# Patient Record
Sex: Male | Born: 1990 | Race: White | Hispanic: No | Marital: Married | Smoking: Current every day smoker
Health system: Southern US, Community
[De-identification: ages and names within clinical notes are randomized; demographics above are authoritative.]

## PROBLEM LIST (undated history)

## (undated) HISTORY — PX: TONSILLECTOMY: SUR1361

---
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
## 2012-03-20 ENCOUNTER — Emergency Department: Payer: Self-pay | Admitting: Emergency Medicine

## 2012-03-20 LAB — URINALYSIS, COMPLETE
Blood: NEGATIVE
Glucose,UR: NEGATIVE mg/dL (ref 0–75)
Ketone: NEGATIVE
Leukocyte Esterase: NEGATIVE
Protein: NEGATIVE
Specific Gravity: 1.016 (ref 1.003–1.030)
Squamous Epithelial: NONE SEEN

## 2012-03-20 LAB — COMPREHENSIVE METABOLIC PANEL
Alkaline Phosphatase: 107 U/L (ref 50–136)
Calcium, Total: 9.1 mg/dL (ref 8.5–10.1)
Chloride: 104 mmol/L (ref 98–107)
Co2: 31 mmol/L (ref 21–32)
Creatinine: 1.16 mg/dL (ref 0.60–1.30)
EGFR (African American): >60
Glucose: 100 mg/dL — ABNORMAL HIGH (ref 65–99)
Potassium: 3.9 mmol/L (ref 3.5–5.1)
SGPT (ALT): 59 U/L
Sodium: 138 mmol/L (ref 136–145)

## 2012-03-20 LAB — CBC
HCT: 49.8 % (ref 40.0–52.0)
HGB: 17.2 g/dL (ref 13.0–18.0)
MCH: 31.4 pg (ref 26.0–34.0)
MCHC: 34.5 g/dL (ref 32.0–36.0)
MCV: 91 fL (ref 80–100)
Platelet: 199 10*3/uL (ref 150–440)
RBC: 5.48 10*6/uL (ref 4.40–5.90)
RDW: 13.3 % (ref 11.5–14.5)
WBC: 8.9 10*3/uL (ref 3.8–10.6)

## 2013-11-28 ENCOUNTER — Emergency Department: Payer: Self-pay | Admitting: Emergency Medicine

## 2015-04-11 ENCOUNTER — Emergency Department
Admission: EM | Admit: 2015-04-11 | Discharge: 2015-04-11 | Disposition: A | Discharge status: Home / Self Care | Payer: Self-pay | Attending: Emergency Medicine | Admitting: Emergency Medicine

## 2015-04-11 ENCOUNTER — Encounter: Payer: Self-pay | Admitting: Urgent Care

## 2015-04-11 ENCOUNTER — Emergency Department: Payer: Self-pay

## 2015-04-11 DIAGNOSIS — R1031 Right lower quadrant pain: Secondary | ICD-10-CM

## 2015-04-11 DIAGNOSIS — K529 Noninfective gastroenteritis and colitis, unspecified: Secondary | ICD-10-CM | POA: Insufficient documentation

## 2015-04-11 DIAGNOSIS — Z720 Tobacco use: Secondary | ICD-10-CM | POA: Insufficient documentation

## 2015-04-11 LAB — URINALYSIS COMPLETE WITH MICROSCOPIC (ARMC ONLY)
BILIRUBIN URINE: NEGATIVE
Glucose, UA: NEGATIVE mg/dL
Hgb urine dipstick: NEGATIVE
Ketones, ur: NEGATIVE mg/dL
Leukocytes, UA: NEGATIVE
Nitrite: NEGATIVE
Protein, ur: NEGATIVE mg/dL
Specific Gravity, Urine: 1.014 (ref 1.005–1.030)
Squamous Epithelial / LPF: NONE SEEN
pH: 6 (ref 5.0–8.0)

## 2015-04-11 LAB — COMPREHENSIVE METABOLIC PANEL
ALBUMIN: 4.6 g/dL (ref 3.5–5.0)
ALT: 28 U/L (ref 17–63)
ANION GAP: 8 (ref 5–15)
AST: 25 U/L (ref 15–41)
Alkaline Phosphatase: 79 U/L (ref 38–126)
BUN: 9 mg/dL (ref 6–20)
CO2: 23 mmol/L (ref 22–32)
CREATININE: 1.1 mg/dL (ref 0.61–1.24)
Calcium: 9.4 mg/dL (ref 8.9–10.3)
Chloride: 104 mmol/L (ref 101–111)
GFR calc Af Amer: >60 mL/min (ref >60)
GFR calc non Af Amer: >60 mL/min (ref >60)
Glucose, Bld: 103 mg/dL — ABNORMAL HIGH (ref 65–99)
POTASSIUM: 3.1 mmol/L — AB (ref 3.5–5.1)
SODIUM: 135 mmol/L (ref 135–145)
Total Bilirubin: 0.6 mg/dL (ref 0.3–1.2)
Total Protein: 7.7 g/dL (ref 6.5–8.1)

## 2015-04-11 LAB — CBC
HCT: 50.4 % (ref 40.0–52.0)
Hemoglobin: 17.2 g/dL (ref 13.0–18.0)
MCH: 31.5 pg (ref 26.0–34.0)
MCHC: 34.2 g/dL (ref 32.0–36.0)
MCV: 92.3 fL (ref 80.0–100.0)
PLATELETS: 181 10*3/uL (ref 150–440)
RBC: 5.47 MIL/uL (ref 4.40–5.90)
RDW: 13.2 % (ref 11.5–14.5)
WBC: 7.9 10*3/uL (ref 3.8–10.6)

## 2015-04-11 LAB — LIPASE, BLOOD: Lipase: 28 U/L (ref 22–51)

## 2015-04-11 MED ORDER — IOHEXOL 300 MG/ML  SOLN
100.0000 mL | Freq: Once | INTRAMUSCULAR | Status: AC | PRN
  Administered 2015-04-11: 100 mL via INTRAVENOUS

## 2015-04-11 MED ORDER — IOHEXOL 240 MG/ML SOLN
25.0000 mL | Freq: Once | INTRAMUSCULAR | Status: AC | PRN
  Administered 2015-04-11: 25 mL via ORAL

## 2015-04-11 MED ORDER — LOPERAMIDE HCL 2 MG PO CAPS
4.0000 mg | ORAL_CAPSULE | Freq: Once | ORAL | Status: AC
  Administered 2015-04-11: 4 mg via ORAL
  Filled 2015-04-11: qty 2

## 2015-04-11 MED ORDER — SODIUM CHLORIDE 0.9 % IV BOLUS (SEPSIS)
1000.0000 mL | Freq: Once | INTRAVENOUS | Status: AC
  Administered 2015-04-11: 1000 mL via INTRAVENOUS

## 2015-04-11 NOTE — ED Provider Notes (Signed)
[facility] Emergency Department Provider Note  ____________________________________________  Time seen: 10:20 PM  I have reviewed the triage vital signs and the nursing notes.   HISTORY  Chief Complaint Abdominal Cramping and Fever    HPI Keith Castillo is a 24 y.o. male who complains of abdominal pain and diarrhea since 3:00 AM today. The abdominal pain started periumbilically but over the day and seemed to migrate toward the right lower quadrant. He's had persistent diarrhea about 20 episodes, worse with food. He last had any oral intake about 4 hours ago.  Denies any medical history, prior surgeries, or prior similar symptoms. He ate normal food recently and does not think that he had any kind of food borne pathogen exposure.  He reports a fever at home to 101.2.  He has had some nausea but no vomiting. No chest pain shortness of breath cough syncope or urinary symptoms.  Abdominal pain is right lower quadrant, sharp, moderate intensity, nonradiating, no aggravating or alleviating factors.   History reviewed. No pertinent past medical history.  There are no active problems to display for this patient.   History reviewed. No pertinent past surgical history.  No current outpatient prescriptions on file.  Allergies Review of patient's allergies indicates no known allergies.  History reviewed. No pertinent family history.  Social History History  Substance Use Topics  . Smoking status: Current Every Day Smoker  . Smokeless tobacco: Never Used  . Alcohol Use: Yes     Comment: .5 gallon of liquor per week    Review of Systems  Constitutional: No fever or chills. No weight changes Eyes:No blurry vision or double vision.  ENT: No sore throat. Cardiovascular: No chest pain. Respiratory: No dyspnea or cough. Gastrointestinal: Abdominal pain as above.  No BRBPR or melena. Genitourinary: Negative for dysuria, urinary retention, bloody urine, or  difficulty urinating. Musculoskeletal: Negative for back pain. No joint swelling or pain. Skin: Negative for rash. Neurological: Negative for headaches, focal weakness or numbness. Psychiatric:No anxiety or depression.   Endocrine:No hot/cold intolerance, changes in energy, or sleep difficulty.  10-point ROS otherwise negative.  ____________________________________________   PHYSICAL EXAM:  VITAL SIGNS: ED Triage Vitals  Enc Vitals Group     BP 04/11/15 2047 139/85 mmHg     Pulse Rate 04/11/15 2047 103     Resp 04/11/15 2047 18     Temp 04/11/15 2047 99.5 F (37.5 C)     Temp Source 04/11/15 2047 Oral     SpO2 04/11/15 2047 97 %     Weight 04/11/15 2047 200 lb (90.719 kg)     Height 04/11/15 2047  (1.651 m)     Head Cir --      Peak Flow --      Pain Score 04/11/15 2048 9     Pain Loc --      Pain Edu? --      Excl. in GC? --      Constitutional: Alert and oriented. Well appearing and in no distress. Eyes: No scleral icterus. No conjunctival pallor. PERRL. EOMI ENT   Head: Normocephalic and atraumatic.   Nose: No congestion/rhinnorhea. No septal hematoma   Mouth/Throat: MMM, no pharyngeal erythema. No peritonsillar mass. No uvula shift.   Neck: No stridor. No SubQ emphysema. No meningismus. Hematological/Lymphatic/Immunilogical: No cervical lymphadenopathy. Cardiovascular: RRR. Normal and symmetric distal pulses are present in all extremities. No murmurs, rubs, or gallops. Respiratory: Normal respiratory effort without tachypnea nor retractions. Breath sounds are clear  and equal bilaterally. No wheezes/rales/rhonchi. Gastrointestinal: Soft with right lower quadrant tenderness.. No distention. There is no CVA tenderness.  No rebound, rigidity, or guarding. Negative obturator, negative Rovsing Genitourinary: deferred Musculoskeletal: Nontender with normal range of motion in all extremities. No joint effusions.  No lower extremity tenderness.  No  edema. Neurologic:   Normal speech and language.  CN 2-10 normal. Motor grossly intact. No pronator drift.  Normal gait. No gross focal neurologic deficits are appreciated.  Skin:  Skin is warm, dry and intact. No rash noted.  No petechiae, purpura, or bullae. Psychiatric: Mood and affect are normal. Speech and behavior are normal. Patient exhibits appropriate insight and judgment.  ____________________________________________    LABS (pertinent positives/negatives) (all labs ordered are listed, but only abnormal results are displayed) Labs Reviewed  COMPREHENSIVE METABOLIC PANEL - Abnormal; Notable for the following:    Potassium 3.1 (*)    Glucose, Bld 103 (*)    All other components within normal limits  URINALYSIS COMPLETEWITH MICROSCOPIC ([facility] ONLY) - Abnormal; Notable for the following:    Color, Urine YELLOW (*)    APPearance CLEAR (*)    Bacteria, UA RARE (*)    All other components within normal limits  LIPASE, BLOOD  CBC   ____________________________________________   EKG    ____________________________________________    RADIOLOGY  CT abdomen and pelvis pending  ____________________________________________   PROCEDURES  ____________________________________________   INITIAL IMPRESSION / ASSESSMENT AND PLAN / ED COURSE  Pertinent labs & imaging results that were available during my care of the patient were reviewed by me and considered in my medical decision making (see chart for details).  Patient presents with right lower quadrant pain. Exam and history are concerning for appendicitis. Low suspicion for perforation and abscess AAA cystitis or pyelonephritis. No evidence of sepsis. The patient is stable, comfortable. We will do a CT scan of the abdomen pelvis to evaluate for appendicitis, and then manage accordingly. Care of the patient will be signed out to the oncoming physician Dr. Manson PasseyBrown will follow-up on the scan and determine disposition.  Patient declines antiemetics or analgesics at this time, but we will give doses as needed for symptom relief if they should worsen.  ____________________________________________   FINAL CLINICAL IMPRESSION(S) / ED DIAGNOSES  Final diagnoses:  Acute right lower quadrant pain      Sharman CheekPhillip Silveria Botz, MD 04/11/15 2235

## 2015-04-11 NOTE — Discharge Instructions (Signed)

## 2015-04-11 NOTE — ED Notes (Signed)
Patient presents with c/p of diffuse abdominal cramping with (+) diarrhea since yesterday. Denies N/V/D and urinary symptoms.

## 2015-04-11 NOTE — ED Provider Notes (Signed)
I assumed care of the patient from Dr. Scotty CourtStafford 11:00 PM. CT scan of the abdomen revealed enteritis involving the terminal ileum. I spoke with the patient who informed me that it was an acute onset today of diarrhea with no episodes like this in the past. Consider the possibility of inflammatory bowel disease namely Crohn's disease however given acute nature with no preceding symptoms this is unlikely. Hence the radiologist concern for enteritis is most likely. Given the possibility that this might be the initial event of Crohn disease patient was advised that if it were to reoccur he is to follow-up with Dr. Ellsworth Lennoxtejan sie. Patient received 4 mg of Imodium prior to discharge from the emergency department In addition the patient was advised to drink 2 L of water and/or Gatorade today.  Darci Currentandolph N Brown, MD 04/11/15 [phone]

## 2015-04-11 NOTE — ED Notes (Signed)
Pt reports abdominal pain and diarrhea x 1 day.  Pt reports 20 episodes of diarrhea.  Pt reports pain is intermittent, worse with movement, described as cramping and sharp.  Pt reports nausea, no vomiting.  Pt NAD at this time.

## 2016-05-18 ENCOUNTER — Observation Stay
Admission: EM | Admit: 2016-05-18 | Discharge: 2016-05-19 | Disposition: A | Discharge status: Home / Self Care | Payer: Self-pay | Attending: Surgery | Admitting: Surgery

## 2016-05-18 ENCOUNTER — Emergency Department: Payer: Self-pay

## 2016-05-18 ENCOUNTER — Encounter: Payer: Self-pay | Admitting: Emergency Medicine

## 2016-05-18 DIAGNOSIS — K5720 Diverticulitis of large intestine with perforation and abscess without bleeding: Secondary | ICD-10-CM

## 2016-05-18 DIAGNOSIS — F17210 Nicotine dependence, cigarettes, uncomplicated: Secondary | ICD-10-CM | POA: Insufficient documentation

## 2016-05-18 DIAGNOSIS — K6389 Other specified diseases of intestine: Principal | ICD-10-CM | POA: Insufficient documentation

## 2016-05-18 DIAGNOSIS — R1031 Right lower quadrant pain: Secondary | ICD-10-CM

## 2016-05-18 DIAGNOSIS — K529 Noninfective gastroenteritis and colitis, unspecified: Secondary | ICD-10-CM | POA: Diagnosis present

## 2016-05-18 LAB — CBC
HEMATOCRIT: 47.9 % (ref 40.0–52.0)
HEMOGLOBIN: 16.8 g/dL (ref 13.0–18.0)
MCH: 31.7 pg (ref 26.0–34.0)
MCHC: 35 g/dL (ref 32.0–36.0)
MCV: 90.6 fL (ref 80.0–100.0)
Platelets: 202 10*3/uL (ref 150–440)
RBC: 5.29 MIL/uL (ref 4.40–5.90)
RDW: 13.3 % (ref 11.5–14.5)
WBC: 13.9 10*3/uL — ABNORMAL HIGH (ref 3.8–10.6)

## 2016-05-18 LAB — URINALYSIS COMPLETE WITH MICROSCOPIC (ARMC ONLY)
BACTERIA UA: NONE SEEN
Bilirubin Urine: NEGATIVE
GLUCOSE, UA: NEGATIVE mg/dL
HGB URINE DIPSTICK: NEGATIVE
Ketones, ur: NEGATIVE mg/dL
Leukocytes, UA: NEGATIVE
Nitrite: NEGATIVE
PROTEIN: NEGATIVE mg/dL
Squamous Epithelial / LPF: NONE SEEN
pH: 6 (ref 5.0–8.0)

## 2016-05-18 LAB — COMPREHENSIVE METABOLIC PANEL
ALBUMIN: 4.4 g/dL (ref 3.5–5.0)
ALT: 41 U/L (ref 17–63)
ANION GAP: 7 (ref 5–15)
AST: 34 U/L (ref 15–41)
Alkaline Phosphatase: 79 U/L (ref 38–126)
BUN: 12 mg/dL (ref 6–20)
CHLORIDE: 103 mmol/L (ref 101–111)
CO2: 27 mmol/L (ref 22–32)
Calcium: 9.3 mg/dL (ref 8.9–10.3)
Creatinine, Ser: 1.08 mg/dL (ref 0.61–1.24)
GFR calc non Af Amer: >60 mL/min (ref >60)
GLUCOSE: 97 mg/dL (ref 65–99)
POTASSIUM: 3.5 mmol/L (ref 3.5–5.1)
SODIUM: 137 mmol/L (ref 135–145)
Total Bilirubin: 0.8 mg/dL (ref 0.3–1.2)
Total Protein: 7.4 g/dL (ref 6.5–8.1)

## 2016-05-18 LAB — LIPASE, BLOOD: LIPASE: 28 U/L (ref 11–51)

## 2016-05-18 MED ORDER — ONDANSETRON HCL 4 MG/2ML IJ SOLN: 4.0000 mg | Freq: Four times a day (QID) | INTRAMUSCULAR | Status: DC | PRN

## 2016-05-18 MED ORDER — NICOTINE 21 MG/24HR TD PT24
21.0000 mg | MEDICATED_PATCH | Freq: Every day | TRANSDERMAL | Status: DC
  Administered 2016-05-18: 21 mg via TRANSDERMAL
  Filled 2016-05-18: qty 1

## 2016-05-18 MED ORDER — ONDANSETRON 4 MG PO TBDP: 4.0000 mg | ORAL_TABLET | Freq: Four times a day (QID) | ORAL | Status: DC | PRN

## 2016-05-18 MED ORDER — MORPHINE SULFATE (PF) 4 MG/ML IV SOLN
4.0000 mg | INTRAVENOUS | Status: DC | PRN
  Administered 2016-05-18: 4 mg via INTRAVENOUS
  Filled 2016-05-18: qty 1

## 2016-05-18 MED ORDER — ENOXAPARIN SODIUM 40 MG/0.4ML ~~LOC~~ SOLN
40.0000 mg | SUBCUTANEOUS | Status: DC
  Administered 2016-05-18: 40 mg via SUBCUTANEOUS
  Filled 2016-05-18: qty 0.4

## 2016-05-18 MED ORDER — PANTOPRAZOLE SODIUM 40 MG PO TBEC
40.0000 mg | DELAYED_RELEASE_TABLET | Freq: Two times a day (BID) | ORAL | Status: DC
  Administered 2016-05-18 – 2016-05-19 (×2): 40 mg via ORAL
  Filled 2016-05-18 (×2): qty 1

## 2016-05-18 MED ORDER — KCL IN DEXTROSE-NACL 40-5-0.45 MEQ/L-%-% IV SOLN
INTRAVENOUS | Status: DC
  Administered 2016-05-18: 19:00:00 via INTRAVENOUS
  Filled 2016-05-18 (×4): qty 1000

## 2016-05-18 MED ORDER — ACETAMINOPHEN 650 MG RE SUPP: 650.0000 mg | Freq: Four times a day (QID) | RECTAL | Status: DC | PRN

## 2016-05-18 MED ORDER — METRONIDAZOLE IN NACL 5-0.79 MG/ML-% IV SOLN
500.0000 mg | Freq: Three times a day (TID) | INTRAVENOUS | Status: DC
  Administered 2016-05-18 – 2016-05-19 (×4): 500 mg via INTRAVENOUS
  Filled 2016-05-18 (×6): qty 100

## 2016-05-18 MED ORDER — ACETAMINOPHEN 325 MG PO TABS
650.0000 mg | ORAL_TABLET | Freq: Four times a day (QID) | ORAL | Status: DC | PRN
  Administered 2016-05-19: 650 mg via ORAL
  Filled 2016-05-18: qty 2

## 2016-05-18 MED ORDER — METRONIDAZOLE IN NACL 5-0.79 MG/ML-% IV SOLN
INTRAVENOUS | Status: AC
  Filled 2016-05-18: qty 100

## 2016-05-18 MED ORDER — IOPAMIDOL (ISOVUE-300) INJECTION 61%
100.0000 mL | Freq: Once | INTRAVENOUS | Status: AC | PRN
  Administered 2016-05-18: 100 mL via INTRAVENOUS
  Filled 2016-05-18: qty 100

## 2016-05-18 MED ORDER — DIATRIZOATE MEGLUMINE & SODIUM 66-10 % PO SOLN
15.0000 mL | Freq: Once | ORAL | Status: AC
  Administered 2016-05-18: 15 mL via ORAL

## 2016-05-18 MED ORDER — HYDROCODONE-ACETAMINOPHEN 5-325 MG PO TABS
1.0000 | ORAL_TABLET | ORAL | Status: DC | PRN
  Administered 2016-05-19: 1 via ORAL
  Filled 2016-05-18: qty 1

## 2016-05-18 MED ORDER — CIPROFLOXACIN IN D5W 400 MG/200ML IV SOLN
400.0000 mg | Freq: Two times a day (BID) | INTRAVENOUS | Status: DC
  Administered 2016-05-18 – 2016-05-19 (×2): 400 mg via INTRAVENOUS
  Filled 2016-05-18 (×4): qty 200

## 2016-05-18 NOTE — ED Triage Notes (Signed)
abd pain rlq x 2 days. Nausea and diarrhea - took a laxative yesterday. Pain goes across to his umbilicus. States he drinks daily which he has had to cut down on since Friday.

## 2016-05-18 NOTE — H&P (Signed)
Keith BusmanMatthew L Castillo is a 25 y.o. male  several day history of right lower quadrant abdominal pain.  HPI: He was in his usual state of good health until recently when he developed some right lower quadrant abdominal pain. The pain was gradual onset but quite severe with some low-grade fever and diarrhea. He had no nausea or vomiting. He was mildly anorexic. Because of this discomfort he presented to the emergency room for further evaluation.  He has similar episode a year ago in July 2016. He relates that abdominal pain episode to trauma but his symptoms were similar. CT scan was performed at that time which demonstrated a possible terminal ileitis and the patient was referred to his primary care doctor for further intervention. Patient's symptoms resolved and he was lost to follow-up.  He denies any other previous GI problems. He has no history of hepatitis yellow jaundice pancreatitis peptic ulcer disease gallbladder disease or diverticulitis. He's had no previous abdominal surgery. He denies any cardiac disease hypertension diabetes or thyroid problems. He has a cigarette smoker smoking approximately 2 packs cigarettes per day. He drinks alcohol regularly.  Workup in the emergency room this afternoon demonstrates white blood cell count of 13,000. CT scan performed today demonstrates thickening of the colon to the hepatic flexure some dilatation of the appendix and some thickening of the distal small bowel. There was one area of possible free air along side the colon. Surgical service was was consulted for possible perforated diverticulitis. History reviewed. No pertinent past medical history. History reviewed. No pertinent surgical history. Social History   Social History  . Marital status: Married    Spouse name: N/A  . Number of children: N/A  . Years of education: N/A   Social History Main Topics  . Smoking status: Current Every Day Smoker    Packs/day: 2.00  . Smokeless tobacco: Never Used  .  Alcohol use Yes     Comment: .5 gallon of liquor per week  . Drug use: No  . Sexual activity: Not Asked   Other Topics Concern  . None   Social History Narrative  . None     Review of Systems  Constitutional: Positive for fever. Negative for chills and weight loss.  HENT: Negative.   Eyes: Negative.   Respiratory: Negative for cough, shortness of breath and wheezing.   Cardiovascular: Negative for chest pain and palpitations.  Gastrointestinal: Positive for abdominal pain and diarrhea. Negative for blood in stool, constipation, heartburn, nausea and vomiting.  Genitourinary: Negative.   Musculoskeletal: Negative.   Skin: Negative.   Neurological: Negative.   Psychiatric/Behavioral: Negative.      PHYSICAL EXAM: BP (!) 150/85 (BP Location: Left Arm)   Pulse 94   Temp 99.1 F (37.3 C) (Oral)   Ht 5\' 5"  (1.651 m)   Wt 95.3 kg (210 lb)   SpO2 97%   BMI 34.95 kg/m   Physical Exam  Constitutional: He is oriented to person, place, and time. He appears well-developed and well-nourished. No distress.  HENT:  Head: Normocephalic and atraumatic.  Eyes: EOM are normal. Pupils are equal, round, and reactive to light.  Neck: Normal range of motion. Neck supple.  Cardiovascular: Normal rate, regular rhythm and normal heart sounds.   Pulmonary/Chest: Effort normal and breath sounds normal. No respiratory distress. He has no wheezes.  Abdominal: Soft. Bowel sounds are normal. He exhibits no distension. There is tenderness. There is guarding. There is no rebound.  Musculoskeletal: Normal range of motion. He exhibits  no edema or deformity.  Neurological: He is alert and oriented to person, place, and time.  Skin: Skin is warm and dry. He is not diaphoretic.  Psychiatric: He has a normal mood and affect. His behavior is normal.   His abdomen is moderately tender right lower quadrant with guarding but no rebound. He has active bowel sounds. No masses are noted.  Impression/Plan: I  have independently reviewed his CT scan. Also reviewed his CT scan from 2009 at 2016. Changes on the last 2 CT scans would suggest inflammatory bowel disease rather than diverticulitis. That diagnosis will also be more consistent with her current clinical presentation and his age. Because of the perforation we would recommend inpatient treatment. We'll place him on IV antibiotics for the appropriate laboratory values and ask and ask gastroenterology to see him when they are available. This plans been discussed with the patient and he is in agreement.   [name], MD  05/18/2016, 4:24 PM

## 2016-05-18 NOTE — ED Provider Notes (Signed)
[facility] Emergency Department Provider Note   ____________________________________________    I have reviewed the triage vital signs and the nursing notes.   HISTORY  Chief Complaint Abdominal Pain     HPI Keith Castillo is a 25 y.o. male who presents with complaints of abdominal pain. Patient reports right lower quadrant abdominal pain that has worsened over the last 2 days. Currently it is moderate and cramping in nature. He reports it is worse with movement and palpation. He reports decreased appetite. No nausea or vomiting. Normal stools. No history of abdominal surgery. No fevers or chills.   History reviewed. No pertinent past medical history.  There are no active problems to display for this patient.   History reviewed. No pertinent surgical history.  Prior to Admission medications   Not on File     Allergies Review of patient's allergies indicates no known allergies.  History reviewed. No pertinent family history.  Social History Social History  Substance Use Topics  . Smoking status: Current Every Day Smoker    Packs/day: 2.00  . Smokeless tobacco: Never Used  . Alcohol use Yes     Comment: .5 gallon of liquor per week    Review of Systems  Constitutional: No fever/chills Eyes: No visual changes.  ENT: No sore throat. Cardiovascular: Denies chest pain. Respiratory: Denies shortness of breath. Gastrointestinal: No abdominal pain.  No nausea, no vomiting.   Genitourinary: Negative for dysuria. Musculoskeletal: Negative for back pain. Skin: Negative for rash. Neurological: Negative for headaches or weakness  10-point ROS otherwise negative.  ____________________________________________   PHYSICAL EXAM:  VITAL SIGNS: ED Triage Vitals  Enc Vitals Group     BP 05/18/16 1125 (!) 150/85     Pulse Rate 05/18/16 1125 94     Resp --      Temp 05/18/16 1125 99.1 F (37.3 C)     Temp Source 05/18/16 1125 Oral   SpO2 05/18/16 1125 97 %     Weight 05/18/16 1125 210 lb (95.3 kg)     Height 05/18/16 1125 5\' 5"  (1.651 m)     Head Circumference --      Peak Flow --      Pain Score 05/18/16 1136 6     Pain Loc --      Pain Edu? --      Excl. in GC? --     Constitutional: Alert and oriented. No acute distress. Pleasant and interactive Eyes: Conjunctivae are normal.  Head: Atraumatic. Nose: No congestion/rhinnorhea. Mouth/Throat: Mucous membranes are moist.   Neck:  Painless ROM Cardiovascular: Normal rate, regular rhythm. Grossly normal heart sounds.  Good peripheral circulation. Respiratory: Normal respiratory effort.  No retractions. Lungs CTAB. Gastrointestinal: Tenderness to palpation in the right lower quadrant. No distention.  No CVA tenderness. Genitourinary: deferred Musculoskeletal: No lower extremity tenderness nor edema.  Warm and well perfused Neurologic:  Normal speech and language. No gross focal neurologic deficits are appreciated.  Skin:  Skin is warm, dry and intact. No rash noted. Psychiatric: Mood and affect are normal. Speech and behavior are normal.  ____________________________________________   LABS (all labs ordered are listed, but only abnormal results are displayed)  Labs Reviewed  CBC - Abnormal; Notable for the following:       Result Value   WBC 13.9 (*)    All other components within normal limits  LIPASE, BLOOD  COMPREHENSIVE METABOLIC PANEL  URINALYSIS COMPLETEWITH MICROSCOPIC ([facility] ONLY)   ____________________________________________  EKG  None ____________________________________________  RADIOLOGY  CT scan most consistent with acute diverticulitis ____________________________________________   PROCEDURES  Procedure(s) performed: No    Critical Care performed: No ____________________________________________   INITIAL IMPRESSION / ASSESSMENT AND PLAN / ED COURSE  Pertinent labs & imaging results that were available during my care of  the patient were reviewed by me and considered in my medical decision making (see chart for details).  Patient with elevated white blood cell count and tenderness to palpation right lower quadrant, we will order CT scan. Patient reports he does not need pain medication this time.  Clinical Course  ----------------------------------------- 3:44 PM on 05/18/2016 -----------------------------------------  CT scan shows likely acute diverticulitis but does not rule out acute appendicitis, I have discussed with Dr. Michela PitcherEly who will see the patient ____________________________________________   FINAL CLINICAL IMPRESSION(S) / ED DIAGNOSES  Final diagnoses:  Right lower quadrant abdominal pain  Diverticulitis of large intestine with perforation without bleeding      NEW MEDICATIONS STARTED DURING THIS VISIT:  New Prescriptions   No medications on file     Note:  This document was prepared using Dragon voice recognition software and may include unintentional dictation errors.    Jene Everyobert Latonia Conrow, MD 05/18/16 [phone]

## 2016-05-18 NOTE — Progress Notes (Signed)
Patient requested to have his antibiotics switched to pill form because he did not want to be stuck for a new IV. Dr.Loflin called and and stated that she would not switch the antibiotics to pill form because he has inflammatory bowel disease and needed the IV antibiotics.  Keith Castillo,Keith Castillo

## 2016-05-18 NOTE — Progress Notes (Signed)
Pt admitted to the floor. No distress noted. Fluids ordered at 75ml/hr, pt could 64not tolerate. Turned down to 1665ml/hr, patient tolerating well. Will continue to monitor. Otilio JeffersonMadelyn S Fenton, RN

## 2016-05-18 NOTE — Progress Notes (Signed)
Patient was informed of what Dr.Loflin had said about not changing his antibiotics to pill form. Patient stated he would let staff insert a new IV so we may continue the IV antibiotics. I attempted 1 IV insertion with no success. Another RN will attempt to insert a new IV. Anselm Junglingonyers,Hendryx Ricke M

## 2016-05-18 NOTE — Progress Notes (Signed)
Patient seen for IS instruction. Patient upset and not receptive to anything at the moment.  Equipment at bedside. RN aware

## 2016-05-19 LAB — BASIC METABOLIC PANEL WITH GFR
Anion gap: 5 (ref 5–15)
BUN: 13 mg/dL (ref 6–20)
CO2: 27 mmol/L (ref 22–32)
Calcium: 8.6 mg/dL — ABNORMAL LOW (ref 8.9–10.3)
Chloride: 105 mmol/L (ref 101–111)
Creatinine, Ser: 0.94 mg/dL (ref 0.61–1.24)
GFR calc Af Amer: >60 mL/min
GFR calc non Af Amer: >60 mL/min
Glucose, Bld: 107 mg/dL — ABNORMAL HIGH (ref 65–99)
Potassium: 3.8 mmol/L (ref 3.5–5.1)
Sodium: 137 mmol/L (ref 135–145)

## 2016-05-19 LAB — CBC
HCT: 45.2 % (ref 40.0–52.0)
Hemoglobin: 15.5 g/dL (ref 13.0–18.0)
MCH: 31.5 pg (ref 26.0–34.0)
MCHC: 34.2 g/dL (ref 32.0–36.0)
MCV: 92.1 fL (ref 80.0–100.0)
Platelets: 162 K/uL (ref 150–440)
RBC: 4.9 MIL/uL (ref 4.40–5.90)
RDW: 13.4 % (ref 11.5–14.5)
WBC: 7.6 K/uL (ref 3.8–10.6)

## 2016-05-19 LAB — C-REACTIVE PROTEIN: CRP: 7.8 mg/dL — AB (ref <1.0)

## 2016-05-19 MED ORDER — METRONIDAZOLE 500 MG PO TABS: 500.0000 mg | ORAL_TABLET | Freq: Three times a day (TID) | ORAL | 0 refills | Status: DC

## 2016-05-19 MED ORDER — CIPROFLOXACIN HCL 500 MG PO TABS: 500.0000 mg | ORAL_TABLET | Freq: Two times a day (BID) | ORAL | 0 refills | Status: DC

## 2016-05-19 NOTE — Progress Notes (Signed)
05/19/2016 5:59 PM  BP 104/65 (BP Location: Right Arm)   Pulse 76   Temp 97.8 F (36.6 C) (Oral)   Resp 18   Ht 5\' 5"  (1.651 m)   Wt 95.3 kg (210 lb)   SpO2 98%   BMI 34.95 kg/m  Patient discharged per MD orders. Discharge instructions reviewed with patient and patient verbalized understanding. IV removed per policy. Prescriptions discussed and to be picked up by patient. Discharged ambulatory, independently.  Ron ParkerHerron, Brockton Mckesson D, RN

## 2016-05-19 NOTE — Discharge Summary (Signed)
  Patient ID: Keith Castillo MRN: [id number] DOB/AGE: 25/12/1990 25 y.o.  Admit date: 05/18/2016 Discharge date: 05/19/2016   Discharge Diagnoses:  Active Problems:   Inflammatory bowel disease  Procedures: none Hospital Course: This 25 year old male admitted with abdominal pain CT findings consistent with some microperforation on colitis. Reviewing the chart it looks consistent with infiltrative bowel disease. He was admitted on place on IV antibiotics. He is abdomen remained soft with significant improvement of his symptoms. He remained afebrile and in no acute distress. I'll double discharge he was ambulating, tolerating regular diet and he was afebrile. His physical exam revealed male in no acute distress, awake, alert his abdomen was soft, nontender no peritonitis. I discussed with him in detail about keeping him 1 more night on asking GI to see him unfortunately there was no GI: Weekend. At this time he wants to [name] about this. We will send him home on antibiotics and with follow up with general surgery and gastroenterology. No surgical intervention at this time. Condition of the patient will discharge stable    Disposition: 01-Home or Self Care  Discharge Instructions    Call MD for:  difficulty breathing, headache or visual disturbances    Complete by:  As directed   Call MD for:  extreme fatigue    Complete by:  As directed   Call MD for:  hives    Complete by:  As directed   Call MD for:  persistant dizziness or light-headedness    Complete by:  As directed   Call MD for:  persistant nausea and vomiting    Complete by:  As directed   Call MD for:  redness, tenderness, or signs of infection (pain, swelling, redness, odor or green/yellow discharge around incision site)    Complete by:  As directed   Call MD for:  severe uncontrolled pain    Complete by:  As directed   Call MD for:  temperature >100.4    Complete by:  As directed   Diet - low sodium heart healthy     Complete by:  As directed   Increase activity slowly    Complete by:  As directed       Medication List    TAKE these medications   ciprofloxacin 500 MG tablet Commonly known as:  CIPRO Take 1 tablet (500 mg total) by mouth 2 (two) times daily.   metroNIDAZOLE 500 MG tablet Commonly known as:  FLAGYL Take 1 tablet (500 mg total) by mouth 3 (three) times daily.      Follow-up Information    Midge Miniumarren Wohl, MD Follow up in 1 week(s).   Specialty:  Gastroenterology Contact information: [address] [place] [id number] [phone]        [facility] Follow up in 1 week(s).   Contact information: [address] [place] [id number] [phone]           Sterling Bigiego Pabon, MD FACS

## 2016-05-21 LAB — SACCHAROMYCES CEREVISIAE ANTIBODIES, IGG AND IGA

## 2016-05-26 ENCOUNTER — Ambulatory Visit: Payer: Self-pay | Admitting: Surgery

## 2016-06-09 ENCOUNTER — Ambulatory Visit: Payer: Self-pay | Admitting: Surgery

## 2016-06-11 ENCOUNTER — Telehealth: Payer: Self-pay

## 2016-06-11 NOTE — Telephone Encounter (Signed)
Keith Castillo had an appointment that had to be canceled because he is self pay and didn't have the $50.00 to be seen today. His case worker is working on getting him [employer] [place]. She mentioned to him that once he get's the medicaid, he will be reimbursed for the money that was paid out of his pocket. I spoke with [place] and [place] called Angie. Per Angie, until we have the medicaid card in front of us, he is considered self pay. Keith Castillo does not have the $50.00 to put down for the appointment at this time.

## 2016-12-25 ENCOUNTER — Observation Stay
Admission: EM | Admit: 2016-12-25 | Discharge: 2016-12-27 | Disposition: A | Discharge status: Home / Self Care | Payer: Self-pay | Attending: Internal Medicine | Admitting: Internal Medicine

## 2016-12-25 ENCOUNTER — Emergency Department: Payer: Self-pay

## 2016-12-25 DIAGNOSIS — B9789 Other viral agents as the cause of diseases classified elsewhere: Secondary | ICD-10-CM

## 2016-12-25 DIAGNOSIS — R509 Fever, unspecified: Secondary | ICD-10-CM

## 2016-12-25 DIAGNOSIS — J209 Acute bronchitis, unspecified: Principal | ICD-10-CM | POA: Insufficient documentation

## 2016-12-25 DIAGNOSIS — J45909 Unspecified asthma, uncomplicated: Secondary | ICD-10-CM | POA: Insufficient documentation

## 2016-12-25 DIAGNOSIS — Z79899 Other long term (current) drug therapy: Secondary | ICD-10-CM | POA: Insufficient documentation

## 2016-12-25 DIAGNOSIS — R062 Wheezing: Secondary | ICD-10-CM

## 2016-12-25 DIAGNOSIS — F17200 Nicotine dependence, unspecified, uncomplicated: Secondary | ICD-10-CM | POA: Insufficient documentation

## 2016-12-25 DIAGNOSIS — J069 Acute upper respiratory infection, unspecified: Secondary | ICD-10-CM

## 2016-12-25 DIAGNOSIS — Z7289 Other problems related to lifestyle: Secondary | ICD-10-CM | POA: Insufficient documentation

## 2016-12-25 DIAGNOSIS — R0902 Hypoxemia: Secondary | ICD-10-CM

## 2016-12-25 LAB — CBC WITH DIFFERENTIAL/PLATELET
Basophils Absolute: 0 10*3/uL (ref 0–0.1)
Basophils Relative: 0 %
EOS ABS: 0.1 10*3/uL (ref 0–0.7)
Eosinophils Relative: 1 %
HCT: 42.9 % (ref 40.0–52.0)
Hemoglobin: 15.1 g/dL (ref 13.0–18.0)
LYMPHS ABS: 2.4 10*3/uL (ref 1.0–3.6)
Lymphocytes Relative: 23 %
MCH: 31.3 pg (ref 26.0–34.0)
MCHC: 35.2 g/dL (ref 32.0–36.0)
MCV: 88.9 fL (ref 80.0–100.0)
Monocytes Absolute: 1 10*3/uL (ref 0.2–1.0)
Monocytes Relative: 10 %
NEUTROS PCT: 66 %
Neutro Abs: 7.2 10*3/uL — ABNORMAL HIGH (ref 1.4–6.5)
PLATELETS: 154 10*3/uL (ref 150–440)
RBC: 4.83 MIL/uL (ref 4.40–5.90)
RDW: 12.5 % (ref 11.5–14.5)
WBC: 10.7 10*3/uL — AB (ref 3.8–10.6)

## 2016-12-25 LAB — COMPREHENSIVE METABOLIC PANEL
ALK PHOS: 54 U/L (ref 38–126)
ALT: 36 U/L (ref 17–63)
AST: 39 U/L (ref 15–41)
Albumin: 4.2 g/dL (ref 3.5–5.0)
Anion gap: 8 (ref 5–15)
BILIRUBIN TOTAL: 0.7 mg/dL (ref 0.3–1.2)
BUN: 9 mg/dL (ref 6–20)
CALCIUM: 8.8 mg/dL — AB (ref 8.9–10.3)
CHLORIDE: 105 mmol/L (ref 101–111)
CO2: 24 mmol/L (ref 22–32)
CREATININE: 0.82 mg/dL (ref 0.61–1.24)
Glucose, Bld: 96 mg/dL (ref 65–99)
Potassium: 3.3 mmol/L — ABNORMAL LOW (ref 3.5–5.1)
Sodium: 137 mmol/L (ref 135–145)
Total Protein: 7.3 g/dL (ref 6.5–8.1)

## 2016-12-25 LAB — LACTIC ACID, PLASMA: LACTIC ACID, VENOUS: 0.7 mmol/L (ref 0.5–1.9)

## 2016-12-25 MED ORDER — KETOROLAC TROMETHAMINE 30 MG/ML IJ SOLN: 10.0000 mg | Freq: Once | INTRAMUSCULAR | Status: DC

## 2016-12-25 MED ORDER — ONDANSETRON HCL 4 MG/2ML IJ SOLN
4.0000 mg | Freq: Once | INTRAMUSCULAR | Status: AC
Start: 2016-12-26 — End: 2016-12-26
  Administered 2016-12-26: 4 mg via INTRAVENOUS
  Filled 2016-12-25: qty 2

## 2016-12-25 MED ORDER — HYDROCOD POLST-CPM POLST ER 10-8 MG/5ML PO SUER
5.0000 mL | Freq: Once | ORAL | Status: AC
Start: 2016-12-26 — End: 2016-12-26
  Administered 2016-12-26: 5 mL via ORAL
  Filled 2016-12-25: qty 5

## 2016-12-25 MED ORDER — SODIUM CHLORIDE 0.9 % IV BOLUS (SEPSIS)
1000.0000 mL | Freq: Once | INTRAVENOUS | Status: AC
  Administered 2016-12-26: 1000 mL via INTRAVENOUS

## 2016-12-25 MED ORDER — ACETAMINOPHEN 500 MG PO TABS
1000.0000 mg | ORAL_TABLET | Freq: Once | ORAL | Status: AC
  Administered 2016-12-26: 1000 mg via ORAL
  Filled 2016-12-25: qty 2

## 2016-12-25 MED ORDER — IPRATROPIUM-ALBUTEROL 0.5-2.5 (3) MG/3ML IN SOLN
3.0000 mL | Freq: Once | RESPIRATORY_TRACT | Status: AC
  Administered 2016-12-26: 3 mL via RESPIRATORY_TRACT
  Filled 2016-12-25: qty 3

## 2016-12-25 MED ORDER — METHYLPREDNISOLONE SODIUM SUCC 125 MG IJ SOLR
125.0000 mg | Freq: Once | INTRAMUSCULAR | Status: AC
  Administered 2016-12-26: 125 mg via INTRAVENOUS
  Filled 2016-12-25: qty 2

## 2016-12-25 NOTE — ED Provider Notes (Signed)
[facility] Emergency Department Provider Note   ____________________________________________   First MD Initiated Contact with Patient 12/25/16 2338     (approximate)  I have reviewed the triage vital signs and the nursing notes.   HISTORY  Chief Complaint Cough and Fever    HPI Keith Castillo is a 26 y.o. male who presents to the ED from home with a chief complaint of flulike symptoms. Patient reports symptoms for 1.5 weeks. Complains of low-grade fevers, chills, nonproductive cough, nausea and sore throat. Denies associated chest pain, shortness of breath, vomiting or diarrhea.States he can hear himself wheeze sometimes. Denies recent travel or trauma. Nothing makes his symptoms better or worse.   Past medical history Childhood asthma  Patient Active Problem List   Diagnosis Date Noted  . Inflammatory bowel disease 05/18/2016    Past Surgical History:  Procedure Laterality Date  . TONSILLECTOMY      Prior to Admission medications   Medication Sig Start Date End Date Taking? Authorizing Provider  ciprofloxacin (CIPRO) 500 MG tablet Take 1 tablet (500 mg total) by mouth 2 (two) times daily. 05/19/16   Diego F Pabon, MD  metroNIDAZOLE (FLAGYL) 500 MG tablet Take 1 tablet (500 mg total) by mouth 3 (three) times daily. 05/19/16   Diego Ronnette Juniper, MD    Allergies Patient has no known allergies.  No family history on file.  Social History Social History  Substance Use Topics  . Smoking status: Current Every Day Smoker    Packs/day: 2.00  . Smokeless tobacco: Never Used  . Alcohol use Yes     Comment: .5 gallon of liquor per week    Review of Systems  Constitutional: Positive for generalized malaise, myalgias and fever/chills. Eyes: No visual changes. ENT: Positive for sore throat. Cardiovascular: Denies chest pain. Respiratory: Positive for cough and wheezing. Denies shortness of breath. Gastrointestinal: No abdominal pain.  Positive  for nausea, no vomiting.  No diarrhea.  No constipation. Genitourinary: Negative for dysuria. Musculoskeletal: Negative for back pain. Skin: Negative for rash. Neurological: Negative for headaches, focal weakness or numbness.  10-point ROS otherwise negative.  ____________________________________________   PHYSICAL EXAM:  VITAL SIGNS: ED Triage Vitals  Enc Vitals Group     BP 12/25/16 2258 116/75     Pulse Rate 12/25/16 2258 (!) 101     Resp 12/25/16 2258 16     Temp 12/25/16 2258 (!) 100.6 F (38.1 C)     Temp Source 12/25/16 2258 Oral     SpO2 12/25/16 2258 92 %     Weight 12/25/16 2253 200 lb (90.7 kg)     Height 12/25/16 2253 5\' 5"  (1.651 m)     Head Circumference --      Peak Flow --      Pain Score 12/25/16 2253 7     Pain Loc --      Pain Edu? --      Excl. in GC? --     Constitutional: Alert and oriented. Well appearing and in mild acute distress. Eyes: Conjunctivae are normal. PERRL. EOMI. Head: Atraumatic. Nose: Congestion/rhinnorhea. Mouth/Throat: Mucous membranes are moist.  Oropharynx mildly erythematous without tonsillar swelling, exudates or peritonsillar abscess. There is no hoarse or muffled voice. There is no drooling. Neck: No stridor.  Supple neck without meningismus. Hematological/Lymphatic/Immunilogical: No cervical lymphadenopathy. Cardiovascular: Normal rate, regular rhythm. Grossly normal heart sounds.  Good peripheral circulation. Respiratory: Normal respiratory effort.  No retractions. Lungs with scattered wheezing. Gastrointestinal: Soft and nontender.  No distention. No abdominal bruits. No CVA tenderness. Musculoskeletal: No lower extremity tenderness nor edema.  No joint effusions. Neurologic:  Normal speech and language. No gross focal neurologic deficits are appreciated. No gait instability. Skin:  Skin is warm, dry and intact. No rash noted. No petechiae. Psychiatric: Mood and affect are normal. Speech and behavior are  normal.  ____________________________________________   LABS (all labs ordered are listed, but only abnormal results are displayed)  Labs Reviewed  COMPREHENSIVE METABOLIC PANEL - Abnormal; Notable for the following:       Result Value   Potassium 3.3 (*)    Calcium 8.8 (*)    All other components within normal limits  CBC WITH DIFFERENTIAL/PLATELET - Abnormal; Notable for the following:    WBC 10.7 (*)    Neutro Abs 7.2 (*)    All other components within normal limits  URINALYSIS, COMPLETE (UACMP) WITH MICROSCOPIC - Abnormal; Notable for the following:    Color, Urine YELLOW (*)    APPearance CLEAR (*)    All other components within normal limits  LACTIC ACID, PLASMA  LACTIC ACID, PLASMA   ____________________________________________  EKG  None ____________________________________________  RADIOLOGY  Chest x-ray interpreted per Dr. Sterling BigKwon: No active cardiopulmonary disease. ____________________________________________   PROCEDURES  Procedure(s) performed: None  Procedures  Critical Care performed: No  ____________________________________________   INITIAL IMPRESSION / ASSESSMENT AND PLAN / ED COURSE  Pertinent labs & imaging results that were available during my care of the patient were reviewed by me and considered in my medical decision making (see chart for details).  26 year old male who presents with flulike symptoms for 1.5 weeks. Laboratory imaging results are unremarkable. Will initiate IV fluid resuscitation, steroid and nebulizer treatment for wheezing heard on exam, Tussionex, Tylenol for low-grade fever and reassess.  Clinical Course as of Dec 27 214  Fri Dec 26, 2016  0120 Wheezing cleared from all fields except left lung base. Room air saturations 89, increases to 95% with breathing. Will administer second nebulizer treatment.  [JS]  0212 Diffuse wheezing after second DuoNeb. Room air saturations 87%. 2 L nasal cannula oxygen applied. Will  discuss with hospitalist to evaluate patient in the emergency department for admission.  [JS]    Clinical Course User Index [JS] Irean HongJade J Layton Naves, MD     ____________________________________________   FINAL CLINICAL IMPRESSION(S) / ED DIAGNOSES  Final diagnoses:  Viral URI with cough  Hypoxia  Wheezing  Fever, unspecified fever cause      NEW MEDICATIONS STARTED DURING THIS VISIT:  New Prescriptions   No medications on file     Note:  This document was prepared using Dragon voice recognition software and may include unintentional dictation errors.    Irean HongJade J Abelardo Seidner, MD 12/26/16 [phone]

## 2016-12-25 NOTE — ED Triage Notes (Signed)
Pt with c/o fever, chills, nausea and productive cough x1 to 1 1/2 weeks. Pt states "blood" in sputum from cough, along with chest congestion and pain. Pt denies emesis or diarrhea. Last dose of OTC medications taken was 4pm today (aspirin).

## 2016-12-26 DIAGNOSIS — J209 Acute bronchitis, unspecified: Secondary | ICD-10-CM | POA: Diagnosis present

## 2016-12-26 LAB — CBC
HEMATOCRIT: 39.4 % — AB (ref 40.0–52.0)
Hemoglobin: 13.7 g/dL (ref 13.0–18.0)
MCH: 31.1 pg (ref 26.0–34.0)
MCHC: 34.8 g/dL (ref 32.0–36.0)
MCV: 89.2 fL (ref 80.0–100.0)
PLATELETS: 155 10*3/uL (ref 150–440)
RBC: 4.42 MIL/uL (ref 4.40–5.90)
RDW: 12.8 % (ref 11.5–14.5)
WBC: 6.7 10*3/uL (ref 3.8–10.6)

## 2016-12-26 LAB — URINALYSIS, COMPLETE (UACMP) WITH MICROSCOPIC
BACTERIA UA: NONE SEEN
BILIRUBIN URINE: NEGATIVE
Glucose, UA: NEGATIVE mg/dL
Hgb urine dipstick: NEGATIVE
KETONES UR: NEGATIVE mg/dL
Leukocytes, UA: NEGATIVE
Nitrite: NEGATIVE
PROTEIN: NEGATIVE mg/dL
Specific Gravity, Urine: 1.008 (ref 1.005–1.030)
Squamous Epithelial / LPF: NONE SEEN
pH: 6 (ref 5.0–8.0)

## 2016-12-26 LAB — BASIC METABOLIC PANEL
Anion gap: 7 (ref 5–15)
BUN: 10 mg/dL (ref 6–20)
CHLORIDE: 107 mmol/L (ref 101–111)
CO2: 25 mmol/L (ref 22–32)
CREATININE: 0.87 mg/dL (ref 0.61–1.24)
Calcium: 8.4 mg/dL — ABNORMAL LOW (ref 8.9–10.3)
GFR calc Af Amer: >60 mL/min (ref >60)
GFR calc non Af Amer: >60 mL/min (ref >60)
Glucose, Bld: 193 mg/dL — ABNORMAL HIGH (ref 65–99)
POTASSIUM: 3.3 mmol/L — AB (ref 3.5–5.1)
Sodium: 139 mmol/L (ref 135–145)

## 2016-12-26 LAB — INFLUENZA PANEL BY PCR (TYPE A & B)
Influenza A By PCR: NEGATIVE
Influenza B By PCR: NEGATIVE

## 2016-12-26 MED ORDER — GUAIFENESIN ER 600 MG PO TB12
600.0000 mg | ORAL_TABLET | Freq: Two times a day (BID) | ORAL | Status: DC
  Administered 2016-12-26 – 2016-12-27 (×3): 600 mg via ORAL
  Filled 2016-12-26 (×3): qty 1

## 2016-12-26 MED ORDER — ACETAMINOPHEN 325 MG PO TABS: 650.0000 mg | ORAL_TABLET | Freq: Four times a day (QID) | ORAL | Status: DC | PRN

## 2016-12-26 MED ORDER — ALBUTEROL SULFATE (2.5 MG/3ML) 0.083% IN NEBU: 2.5000 mg | INHALATION_SOLUTION | Freq: Four times a day (QID) | RESPIRATORY_TRACT | Status: DC | PRN

## 2016-12-26 MED ORDER — ONDANSETRON HCL 4 MG/2ML IJ SOLN: 4.0000 mg | Freq: Four times a day (QID) | INTRAMUSCULAR | Status: DC | PRN

## 2016-12-26 MED ORDER — SENNOSIDES-DOCUSATE SODIUM 8.6-50 MG PO TABS: 1.0000 | ORAL_TABLET | Freq: Every evening | ORAL | Status: DC | PRN

## 2016-12-26 MED ORDER — KETOROLAC TROMETHAMINE 30 MG/ML IJ SOLN: 30.0000 mg | Freq: Four times a day (QID) | INTRAMUSCULAR | Status: DC | PRN

## 2016-12-26 MED ORDER — IPRATROPIUM-ALBUTEROL 0.5-2.5 (3) MG/3ML IN SOLN
3.0000 mL | Freq: Once | RESPIRATORY_TRACT | Status: AC
  Administered 2016-12-26: 3 mL via RESPIRATORY_TRACT
  Filled 2016-12-26: qty 3

## 2016-12-26 MED ORDER — ONDANSETRON HCL 4 MG PO TABS: 4.0000 mg | ORAL_TABLET | Freq: Four times a day (QID) | ORAL | Status: DC | PRN

## 2016-12-26 MED ORDER — ACETAMINOPHEN 650 MG RE SUPP: 650.0000 mg | Freq: Four times a day (QID) | RECTAL | Status: DC | PRN

## 2016-12-26 MED ORDER — METHYLPREDNISOLONE SODIUM SUCC 125 MG IJ SOLR
60.0000 mg | Freq: Four times a day (QID) | INTRAMUSCULAR | Status: DC
  Administered 2016-12-26 – 2016-12-27 (×6): 60 mg via INTRAVENOUS
  Filled 2016-12-26 (×6): qty 2

## 2016-12-26 MED ORDER — DM-GUAIFENESIN ER 30-600 MG PO TB12: 1.0000 | ORAL_TABLET | Freq: Two times a day (BID) | ORAL | Status: DC

## 2016-12-26 MED ORDER — BISACODYL 5 MG PO TBEC: 5.0000 mg | DELAYED_RELEASE_TABLET | Freq: Every day | ORAL | Status: DC | PRN

## 2016-12-26 MED ORDER — SODIUM CHLORIDE 0.9 % IV SOLN
INTRAVENOUS | Status: DC
  Administered 2016-12-26: 04:00:00 via INTRAVENOUS

## 2016-12-26 MED ORDER — LORAZEPAM 2 MG/ML IJ SOLN: 1.0000 mg | INTRAMUSCULAR | Status: DC | PRN

## 2016-12-26 MED ORDER — KETOROLAC TROMETHAMINE 60 MG/2ML IM SOLN
INTRAMUSCULAR | Status: AC
  Administered 2016-12-26: 9.9 mg via INTRAVENOUS
  Filled 2016-12-26: qty 2

## 2016-12-26 MED ORDER — FOLIC ACID 1 MG PO TABS
1.0000 mg | ORAL_TABLET | Freq: Every day | ORAL | Status: DC
Start: 2016-12-26 — End: 2016-12-27
  Administered 2016-12-26 – 2016-12-27 (×2): 1 mg via ORAL
  Filled 2016-12-26 (×2): qty 1

## 2016-12-26 MED ORDER — ADULT MULTIVITAMIN W/MINERALS CH
1.0000 | ORAL_TABLET | Freq: Every day | ORAL | Status: DC
  Administered 2016-12-26 – 2016-12-27 (×2): 1 via ORAL
  Filled 2016-12-26 (×2): qty 1

## 2016-12-26 MED ORDER — ZOLPIDEM TARTRATE 5 MG PO TABS: 5.0000 mg | ORAL_TABLET | Freq: Every evening | ORAL | Status: DC | PRN

## 2016-12-26 MED ORDER — IPRATROPIUM BROMIDE 0.02 % IN SOLN: 0.5000 mg | Freq: Four times a day (QID) | RESPIRATORY_TRACT | Status: DC | PRN

## 2016-12-26 MED ORDER — OXYCODONE HCL 5 MG PO TABS: 5.0000 mg | ORAL_TABLET | ORAL | Status: DC | PRN

## 2016-12-26 MED ORDER — IPRATROPIUM-ALBUTEROL 0.5-2.5 (3) MG/3ML IN SOLN
3.0000 mL | Freq: Four times a day (QID) | RESPIRATORY_TRACT | Status: DC
  Administered 2016-12-26 – 2016-12-27 (×5): 3 mL via RESPIRATORY_TRACT
  Filled 2016-12-26 (×6): qty 3

## 2016-12-26 MED ORDER — VITAMIN B-1 100 MG PO TABS
100.0000 mg | ORAL_TABLET | Freq: Every day | ORAL | Status: DC
  Administered 2016-12-27: 100 mg via ORAL
  Filled 2016-12-26: qty 1

## 2016-12-26 MED ORDER — HYDROCOD POLST-CPM POLST ER 10-8 MG/5ML PO SUER
5.0000 mL | Freq: Two times a day (BID) | ORAL | Status: DC
  Administered 2016-12-26 – 2016-12-27 (×3): 5 mL via ORAL
  Filled 2016-12-26 (×3): qty 5

## 2016-12-26 MED ORDER — MAGNESIUM CITRATE PO SOLN
1.0000 | Freq: Once | ORAL | Status: DC | PRN
  Filled 2016-12-26: qty 296

## 2016-12-26 MED ORDER — POTASSIUM CHLORIDE CRYS ER 20 MEQ PO TBCR
40.0000 meq | EXTENDED_RELEASE_TABLET | Freq: Once | ORAL | Status: AC
  Administered 2016-12-26: 40 meq via ORAL
  Filled 2016-12-26: qty 2

## 2016-12-26 MED ORDER — NICOTINE 21 MG/24HR TD PT24
21.0000 mg | MEDICATED_PATCH | Freq: Every day | TRANSDERMAL | Status: DC
  Filled 2016-12-26 (×2): qty 1

## 2016-12-26 MED ORDER — DEXTROMETHORPHAN POLISTIREX ER 30 MG/5ML PO SUER
30.0000 mg | Freq: Two times a day (BID) | ORAL | Status: DC
  Administered 2016-12-26 – 2016-12-27 (×3): 30 mg via ORAL
  Filled 2016-12-26 (×5): qty 5

## 2016-12-26 NOTE — Progress Notes (Addendum)
Patient alert and oriented. Wheezing on auscultation. Minimal coughing. Patient not able to cough up enough mucous to collect sputum. Flu negative. No other needs at this time. Patient refused nicotine patch.  Harvie Heck, RN

## 2016-12-26 NOTE — Plan of Care (Signed)
Problem: Respiratory: Goal: Complications related to the disease process, condition or treatment will be avoided or minimized Outcome: Progressing Patient free from fever and SOB. Flu results negative. 93% on room air. Expiratory wheezing on auscultation. Patient complaining of some body soreness.   Harvie Heck, RN

## 2016-12-26 NOTE — ED Notes (Signed)
Pt transported to room 154 

## 2016-12-26 NOTE — H&P (Addendum)
History and Physical   [facility] @ [facility] Admission History and Physical [employer], D.O.    Patient Name: Keith Castillo MR#: [id number] Date of Birth: 09/24/1991 Date of Admission: 12/25/2016  Referring MD/NP/PA: Dr. Dolores Frame Primary Care Physician: No PCP Per Patient Patient coming from: Home Outpatient Specialists: None   Chief Complaint:  Chief Complaint  Patient presents with  . Cough  . Fever    HPI: Keith Castillo is a 26 y.o. male with a known history of childhood asthma, longstanding tobacco use, chronic EtOH presents to the emergency department for evaluation of SOB x 1 week.  Patient was in a usual state of health until 9 days ago when he developed fevers, chills, nonproductive cough, shortness of breath.   Has not sought medical attention and his symptoms have been refractory to OTC medications.  He thought he was experiencing DTs as he drinks 6-7 ounces of rum daily. No shakes, seizures, AMS.  Last drink 3 days ago.    History of childhood asthma with no hospitalizations, intubations, ER visits.     Otherwise there has been no change in status. Patient has been taking medication as prescribed and there has been no recent change in medication or diet.  No recent antibiotics.  There has been no recent illness, hospitalizations, travel or sick contacts.    EMS/ED Course: Patient received Duonebs, Solumedrol, Zofran, toradol, Tussionex.  Review of Systems:  CONSTITUTIONAL: Positive fever/chills, fatigue, weakness. Negative weight gain/loss, headache. EYES: No blurry or double vision. ENT: No tinnitus, postnasal drip, redness or soreness of the oropharynx. RESPIRATORY: Positive cough, dyspnea, wheeze.  No hemoptysis.  CARDIOVASCULAR: No chest pain, palpitations, syncope, orthopnea. No lower extremity edema.  GASTROINTESTINAL: No nausea, vomiting, abdominal pain, diarrhea, constipation.  No hematemesis, melena or hematochezia. GENITOURINARY: No dysuria,  frequency, hematuria. ENDOCRINE: No polyuria or nocturia. No heat or cold intolerance. HEMATOLOGY: No anemia, bruising, bleeding. INTEGUMENTARY: No rashes, ulcers, lesions. MUSCULOSKELETAL: No arthritis, gout, dyspnea. NEUROLOGIC: No numbness, tingling, ataxia, seizure-type activity, weakness. PSYCHIATRIC: No anxiety, depression, insomnia.   PMH: Childhood asthma - no hospitalizations, no intubations  Past Surgical History:  Procedure Laterality Date  . TONSILLECTOMY       reports that he has been smoking.  He has been smoking about 2.00 packs per day. He has never used smokeless tobacco. He reports that he drinks alcohol. He reports that he does not use drugs. 6-7 ounces liquor daily for many years.  Smoked 1/5 packs of cigarettes for 10 years.   No Known Allergies  Fam History: Father died from COPD  Prior to Admission medications   Medication Sig Start Date End Date Taking? Authorizing Provider  ciprofloxacin (CIPRO) 500 MG tablet Take 1 tablet (500 mg total) by mouth 2 (two) times daily. 05/19/16   Diego F Pabon, MD  metroNIDAZOLE (FLAGYL) 500 MG tablet Take 1 tablet (500 mg total) by mouth 3 (three) times daily. 05/19/16   Leafy Ro, MD    Physical Exam: Vitals:   12/25/16 2253 12/25/16 2258 12/26/16 0103 12/26/16 0126  BP:  116/75 109/88   Pulse:  (!) 101 94   Resp:  16    Temp:  (!) 100.6 F (38.1 C)  98.5 F (36.9 C)  TempSrc:  Oral  Oral  SpO2:  92% 94%   Weight: 90.7 kg (200 lb)     Height:  (1.651 m)       GENERAL: 26 y.o.-year-old white male patient, well-developed, well-nourished lying in  the bed in no acute distress.  Pale, ill-appearing.   HEENT: Head atraumatic, normocephalic. Pupils equal, round, reactive to light and accommodation. No scleral icterus. Extraocular muscles intact. Nares are patent. Oropharynx is clear. Mucus membranes moist. NECK: Supple, full range of motion. No JVD, no bruit heard. No thyroid enlargement, no tenderness, no  cervical lymphadenopathy. CHEST: Diffuse wheezing, poor inspiratory effort.   No use of accessory muscles of respiration.  No reproducible chest wall tenderness.  CARDIOVASCULAR: S1, S2 normal. No murmurs, rubs, or gallops. Cap refill <2 seconds. Pulses intact distally.  ABDOMEN: Soft, nondistended, nontender. No rebound, guarding, rigidity. Normoactive bowel sounds present in all four quadrants. No organomegaly or mass. EXTREMITIES: No pedal edema, cyanosis, or clubbing. No calf tenderness or Homan's sign.  NEUROLOGIC: The patient is alert and oriented x 3. Cranial nerves II through XII are grossly intact with no focal sensorimotor deficit. Muscle strength 5/5 in all extremities. Sensation intact. Gait not checked. PSYCHIATRIC:  Normal affect, mood, thought content. SKIN: Warm, dry, and intact without obvious rash, lesion, or ulcer.    Labs on Admission:  CBC:  Recent Labs Lab 12/25/16 2304  WBC 10.7*  NEUTROABS 7.2*  HGB 15.1  HCT 42.9  MCV 88.9  PLT 154   Basic Metabolic Panel:  Recent Labs Lab 12/25/16 2304  NA 137  K 3.3*  CL 105  CO2 24  GLUCOSE 96  BUN 9  CREATININE 0.82  CALCIUM 8.8*   GFR: Estimated Creatinine Clearance: 142.6 mL/min (by C-G formula based on SCr of 0.82 mg/dL). Liver Function Tests:  Recent Labs Lab 12/25/16 2304  AST 39  ALT 36  ALKPHOS 54  BILITOT 0.7  PROT 7.3  ALBUMIN 4.2   No results for input(s): LIPASE, AMYLASE in the last 168 hours. No results for input(s): AMMONIA in the last 168 hours. Coagulation Profile: No results for input(s): INR, PROTIME in the last 168 hours. Cardiac Enzymes: No results for input(s): CKTOTAL, CKMB, CKMBINDEX, TROPONINI in the last 168 hours. BNP (last 3 results) No results for input(s): PROBNP in the last 8760 hours. HbA1C: No results for input(s): HGBA1C in the last 72 hours. CBG: No results for input(s): GLUCAP in the last 168 hours. Lipid Profile: No results for input(s): CHOL, HDL,  LDLCALC, TRIG, CHOLHDL, LDLDIRECT in the last 72 hours. Thyroid Function Tests: No results for input(s): TSH, T4TOTAL, FREET4, T3FREE, THYROIDAB in the last 72 hours. Anemia Panel: No results for input(s): VITAMINB12, FOLATE, FERRITIN, TIBC, IRON, RETICCTPCT in the last 72 hours. Urine analysis:    Component Value Date/Time   COLORURINE YELLOW (A) 12/26/2016 0123   APPEARANCEUR CLEAR (A) 12/26/2016 0123   APPEARANCEUR Clear 03/20/2012 1216   LABSPEC 1.008 12/26/2016 0123   LABSPEC 1.016 03/20/2012 1216   PHURINE 6.0 12/26/2016 0123   GLUCOSEU NEGATIVE 12/26/2016 0123   GLUCOSEU Negative 03/20/2012 1216   HGBUR NEGATIVE 12/26/2016 0123   BILIRUBINUR NEGATIVE 12/26/2016 0123   BILIRUBINUR Negative 03/20/2012 1216   KETONESUR NEGATIVE 12/26/2016 0123   PROTEINUR NEGATIVE 12/26/2016 0123   NITRITE NEGATIVE 12/26/2016 0123   LEUKOCYTESUR NEGATIVE 12/26/2016 0123   LEUKOCYTESUR Negative 03/20/2012 1216   Sepsis Labs: (procalcitonin:4,lacticidven:4) )No results found for this or any previous visit (from the past 240 hour(s)).   Radiological Exams on Admission: Dg Chest 2 View  Result Date: 12/25/2016 CLINICAL DATA:  Fevers, chills, nausea and productive cough times 1-1.5 weeks EXAM: CHEST  2 VIEW COMPARISON:  None. FINDINGS: The heart size and mediastinal contours are within  normal limits. Both lungs are clear. The visualized skeletal structures are unremarkable. IMPRESSION: No active cardiopulmonary disease. Electronically Signed   By: Tollie Eth M.D.   On: 12/25/2016 23:34   Assessment/Plan  This is a 26 y.o. male with a history of childhood asthma now being admitted with: #. Acute bronchitis with hypoxia -Admit to observation with continuous pulse oximetry. -Continue nebulizers, O2 and tapering steroids -Expectorant as needed -Consider pulmonology consult if not improving.   #. Hypokalemia -Oral K  #. Tobacco use disorder - Cessation advised - Nicotine patch  PRN  #. Alcohol use disorder - 6-7 ounces liquor daily, last drink 3 days ago - Monitor for signs of withdrawal - Ativan if needed, May institute CIWA although should be outside window for withdrawal - Thiamine - Cessation advised.   Admission status: Observation, continuous pulse ox IV Fluids: NS Diet/Nutrition: Regular Consults called: None  DVT Px:  SCDs and early ambulation. Code Status: Full Code  Disposition Plan: To home in 1-2 days  All the records are reviewed and case discussed with ED provider. Management plans discussed with the patient and/or family who express understanding and agree with plan of care.  Zsofia Prout D.O. on 12/26/2016 at 2:20 AM Between 7am to 6pm - Pager - [phone] After 6pm go to [email or web address] - [employer] [facility] Office [phone] CC: Primary care physician; No PCP Per Patient   12/26/2016, 2:20 AM

## 2016-12-26 NOTE — ED Notes (Signed)
EDP placed patient on 2L nasal cannula.

## 2016-12-26 NOTE — Evaluation (Signed)
Physical Therapy Evaluation Patient Details Name: Keith Castillo MRN: [id number] DOB: 07/01/1991 Today's Date: 12/26/2016   History of Present Illness  Pt is a 26 yo male admitted to [facility] on 12/25/16 w/ fevers, chills,  SOB and worsening cough x1 week. Found to have acute bronchitis, PMH inlcudes; alcohol and tobacco abuse, and childhood asthma    Clinical Impression  Pt A&Ox4, displayed good communication and safety awareness during PT evaluation. Overall strength and ROM appear WFLs for tasks assessed and patient is able to perform bed mobility, transfers and ambulate safely under PT supervision. He was also able to void into toilet while standing w/o difficulty. Pt ambulated around nursing station but stated he felt SOB and began coughing once returned back to bed, O2 sat was 95% in bed on room air and patient requested breathing treatment due to cough, nursing immediately notified of pt's request. Pt is able to perform functional tasks but limited in activity tolerance secondary to current cardiopulmonary status above. He currently does not require skilled PT services at this time, as activity intolerance should resolve as medical status improves; will complete orders at this time, please re-submit orders if pt's status changes.     Follow Up Recommendations No PT follow up    Equipment Recommendations  None recommended by PT    Recommendations for Other Services       Precautions / Restrictions Precautions Precautions: None Restrictions Weight Bearing Restrictions: No      Mobility  Bed Mobility Overal bed mobility: Independent                Transfers Overall transfer level: Independent                  Ambulation/Gait Ambulation/Gait assistance: Supervision Ambulation Distance (Feet): 200 Feet   Gait Pattern/deviations: WFL(Within Functional Limits)   Gait velocity interpretation: at or above normal speed for age/gender General Gait Details: ambulates w/  good symmetrical gait pattern, no LOB or staggering noted, stated he felt SOB while ambulating and began coughing so he was safely returned to his room  Stairs            Wheelchair Mobility    Modified Rankin (Stroke Patients Only)       Balance Overall balance assessment: Independent   Sitting balance-Leahy Scale: Normal Sitting balance - Comments: able to sit and maintain upright posture w/o difficulty     Standing balance-Leahy Scale: Normal Standing balance comment: able to stand independently w/o AD, performed voiding standing over toilet                              Pertinent Vitals/Pain Pain Assessment: No/denies pain    Home Living Family/patient expects to be discharged to:: Private residence Living Arrangements: Spouse/significant other;Children Available Help at Discharge: Family Type of Home: House Home Access: Stairs to enter   Secretary/administrator of Steps: 2 Home Layout: One level Home Equipment: None      Prior Function Level of Independence: Independent         Comments: pt independent at baseline, 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)     Higher education careers adviser        Extremity/Trunk Assessment   Upper Extremity Assessment Upper Extremity Assessment: Overall WFL for tasks assessed    Lower Extremity Assessment Lower Extremity Assessment: Overall WFL for tasks assessed    Cervical / Trunk Assessment Cervical /  Trunk Assessment: Normal  Communication   Communication: No difficulties  Cognition Arousal/Alertness: Awake/alert Behavior During Therapy: WFL for tasks assessed/performed Overall Cognitive Status: Within Functional Limits for tasks assessed                                        General Comments      Exercises     Assessment/Plan    PT Assessment Patent does not need any further PT services  PT Problem List         PT Treatment Interventions       PT Goals (Current goals can be found in the Care Plan section)  Acute Rehab PT Goals Patient Stated Goal: Return home PT Goal Formulation: With patient Time For Goal Achievement: 01/09/17 Potential to Achieve Goals: Good    Frequency     Barriers to discharge        Co-evaluation               End of Session   Activity Tolerance: Treatment limited secondary to medical complications (Comment) (coughing) Patient left: in bed;with call bell/phone within reach Nurse Communication: Mobility status;Other (comment) (need for breathing treatment)      Time: 0981-1914 PT Time Calculation (min) (ACUTE ONLY): 11 min   Charges:         PT G Codes:        [employer]  Student PT 12/26/16, 2:16 PM [phone]   Derrik Mceachern 12/26/2016, 2:16 PM

## 2016-12-26 NOTE — Care Management Note (Signed)
Case Management Note  Patient Details  Name: Keith Castillo MRN: [id number] Date of Birth: Feb 06, 1991  Subjective/Objective:   Met with patient at bedside to discuss his discharge plan. He has no PCP. Application given for [facility] and [facility]. Referral sent to both agencies. He is independent and active otherwise. No needs identified.                  Action/Plan:   Expected Discharge Date:  12/27/16               Expected Discharge Plan:  Home/Self Care  In-House Referral:     Discharge planning Services  CM Consult, [place] not met per provider, Medication Assistance  Post Acute Care Choice:    Choice offered to:     DME Arranged:    DME Agency:     HH Arranged:    HH Agency:     Status of Service:  Completed, signed off  If discussed at [employer], dates discussed:    Additional Comments:  Jolly Mango, RN 12/26/2016, 11:25 AM

## 2016-12-27 LAB — HIV ANTIBODY (ROUTINE TESTING W REFLEX): HIV SCREEN 4TH GENERATION: NONREACTIVE

## 2016-12-27 MED ORDER — PREDNISONE 50 MG PO TABS: 50.0000 mg | ORAL_TABLET | Freq: Every day | ORAL | 0 refills | Status: AC

## 2016-12-27 MED ORDER — BENZONATATE 100 MG PO CAPS: 100.0000 mg | ORAL_CAPSULE | Freq: Three times a day (TID) | ORAL | 0 refills | Status: AC | PRN

## 2016-12-27 MED ORDER — AZITHROMYCIN 250 MG PO TABS: 250.0000 mg | ORAL_TABLET | Freq: Every day | ORAL | 0 refills | Status: AC

## 2016-12-27 NOTE — Progress Notes (Signed)
[facility]         [place], [place].   12/27/2016  Patient: Keith Castillo   Date of Birth:  Aug 17, 1991  Date of admission:  12/25/2016  Date of Discharge  12/27/2016    To Whom it May Concern:   [name]  may return to work on 12/29/3016.  If you have any questions or concerns, please don't hesitate to call.  Sincerely,   Milagros Loll R M.D Office : [phone]   .

## 2016-12-27 NOTE — Care Management Note (Signed)
Case Management Note  Patient Details  Name: Keith Castillo MRN: [id number] Date of Birth: 1991-08-19  Subjective/Objective:  Uninsured Mr Keith Castillo was discharged and had left [facility] before this CM could provide him with GoodRx coupons for Zithromax and Prednisone.                   Action/Plan:   Expected Discharge Date:  12/27/16               Expected Discharge Plan:  Home/Self Care  In-House Referral:     Discharge planning Services  CM Consult, [place] not met per provider, Medication Assistance  Post Acute Care Choice:    Choice offered to:     DME Arranged:    DME Agency:     HH Arranged:    HH Agency:     Status of Service:  Completed, signed off  If discussed at [employer], dates discussed:    Additional Comments:  Keith Benegas A, RN 12/27/2016, 12:58 PM

## 2016-12-27 NOTE — Discharge Instructions (Signed)
QUIT SMOKING

## 2016-12-29 NOTE — Discharge Summary (Signed)
[facility] - [facility]   PATIENT NAME: Keith Castillo    MR#:  [id number]  DATE OF BIRTH:  November 05, 1990  DATE OF ADMISSION:  12/25/2016 ADMITTING PHYSICIAN: Jon Gills Hugelmeyer, DO  DATE OF DISCHARGE: 12/27/2016  1:24 PM  PRIMARY CARE PHYSICIAN: No PCP Per Patient   ADMISSION DIAGNOSIS:  Wheezing [R06.2] Hypoxia [R09.02] Viral URI with cough [J06.9, B97.89] Fever, unspecified fever cause [R50.9]  DISCHARGE DIAGNOSIS:  Active Problems:   Acute bronchitis   SECONDARY DIAGNOSIS:  History reviewed. No pertinent past medical history.   ADMITTING HISTORY  HPI: Keith Castillo is a 26 y.o. male with a known history of childhood asthma, longstanding tobacco use, chronic EtOH presents to the emergency department for evaluation of SOB x 1 week.  Patient was in a usual state of health until 9 days ago when he developed fevers, chills, nonproductive cough, shortness of breath.   Has not sought medical attention and his symptoms have been refractory to OTC medications.  He thought he was experiencing DTs as he drinks 6-7 ounces of rum daily. No shakes, seizures, AMS.  Last drink 3 days ago.    History of childhood asthma with no hospitalizations, intubations, ER visits.     Otherwise there has been no change in status. Patient has been taking medication as prescribed and there has been no recent change in medication or diet.  No recent antibiotics.  There has been no recent illness, hospitalizations, travel or sick contacts.    HOSPITAL COURSE:   * Acute bronchitis Patient was treated with IV steroids and nebulizers. With this he improved well. He was counseled to quit smoking. By day of discharge he has mild expiratory wheezing. Feels his breathing is back to normal. Cough improved. Afebrile. He'll be discharged home on azithromycin and oral prednisone.  Stable for discharge home.  CONSULTS OBTAINED:    DRUG ALLERGIES:  No Known Allergies  DISCHARGE MEDICATIONS:    Discharge Medication List as of 12/27/2016 11:59 AM    START taking these medications   Details  azithromycin (ZITHROMAX) 250 MG tablet Take 1 tablet (250 mg total) by mouth daily., Starting Sat 12/27/2016, Print    predniSONE (DELTASONE) 50 MG tablet Take 1 tablet (50 mg total) by mouth daily with breakfast., Starting Sat 12/27/2016, Print      CONTINUE these medications which have CHANGED   Details  benzonatate (TESSALON) 100 MG capsule Take 1 capsule (100 mg total) by mouth 3 (three) times daily as needed., Starting Sat 12/27/2016, Print      CONTINUE these medications which have NOT CHANGED   Details  ibuprofen (ADVIL,MOTRIN) 800 MG tablet Take 800 mg by mouth every 8 (eight) hours as needed., Historical Med      STOP taking these medications     amoxicillin (AMOXIL) 500 MG capsule         Today   VITAL SIGNS:  Blood pressure 139/81, pulse 89, temperature 98.1 F (36.7 C), temperature source Oral, resp. rate 20, height  (1.651 m), weight 91.4 kg (201 lb 6.4 oz), SpO2 97 %.  I/O:  No intake or output data in the 24 hours ending 12/29/16 1731  PHYSICAL EXAMINATION:  Physical Exam  GENERAL:  26 y.o.-year-old patient lying in the bed with no acute distress.  LUNGS: Normal breath sounds bilaterally, no wheezing, rales,rhonchi or crepitation. No use of accessory muscles of respiration.  CARDIOVASCULAR: S1, S2 normal. No murmurs, rubs, or gallops.  ABDOMEN: Soft, non-tender, non-distended. Bowel sounds  present. No organomegaly or mass.  NEUROLOGIC: Moves all 4 extremities. PSYCHIATRIC: The patient is alert and oriented x 3.  SKIN: No obvious rash, lesion, or ulcer.   DATA REVIEW:   CBC  Recent Labs Lab 12/26/16 0517  WBC 6.7  HGB 13.7  HCT 39.4*  PLT 155    Chemistries   Recent Labs Lab 12/25/16 2304 12/26/16 0517  NA 137 139  K 3.3* 3.3*  CL 105 107  CO2 24 25  GLUCOSE 96 193*  BUN 9 10  CREATININE 0.82 0.87  CALCIUM 8.8* 8.4*  AST 39  --    ALT 36  --   ALKPHOS 54  --   BILITOT 0.7  --     Cardiac Enzymes No results for input(s): TROPONINI in the last 168 hours.  Microbiology Results  No results found for this or any previous visit.  RADIOLOGY:  No results found.  Follow up with PCP in 1 week.  Management plans discussed with the patient, family and they are in agreement.  CODE STATUS:  Code Status History    Date Active Date Inactive Code Status Order ID Comments User Context   12/26/2016  3:49 AM 12/27/2016  4:24 PM Full Code [id number]  Tonye Royalty, DO Inpatient   05/18/2016  4:23 PM 05/19/2016  9:05 PM Full Code [id number]  Tiney Rouge III, MD ED      TOTAL TIME TAKING CARE OF THIS PATIENT ON DAY OF DISCHARGE: more than 30 minutes.   Milagros Loll R M.D on 12/29/2016 at 5:31 PM  Between 7am to 6pm - Pager - [phone]  After 6pm go to [email or web address] - password [email or web address] [facility]  [facility]  Office  [phone]  CC: Primary care physician; No PCP Per Patient  Note: This dictation was prepared with Dragon dictation along with smaller phrase technology. Any transcriptional errors that result from this process are unintentional.

## 2017-12-12 IMAGING — CT CT ABD-PELV W/ CM
2 of 4 series · 16 of 46 positions shown, 18 images · IV contrast (APPLIED)
Comparison: 04/11/2015.  03/31/2008.

CLINICAL DATA: Right lower quadrant abdominal pain, 2 days
duration.

EXAM:
CT ABDOMEN AND PELVIS WITH CONTRAST
TECHNIQUE: Multidetector CT imaging of the abdomen and pelvis was performed
using the standard protocol following bolus administration of
intravenous contrast.
CONTRAST:  100mL 9BD3HI-7AA IOPAMIDOL (9BD3HI-7AA) INJECTION 61%

[Series 2: axial st · axial · 0.85mm/px · z∈[-842,-417]mm · 13 of 93 slices shown, 15 images]
[im 4/93  soft-tissue]
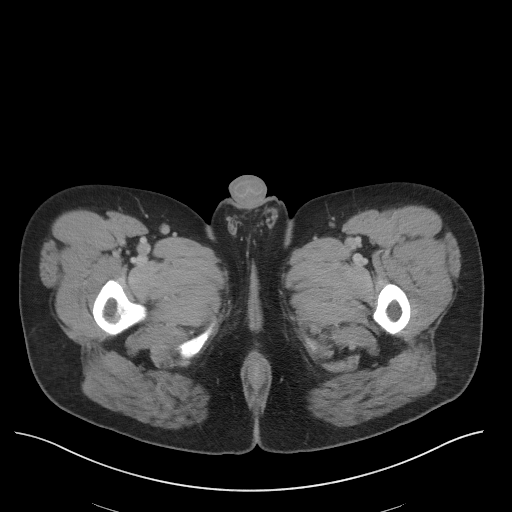
[im 4/93  bone]
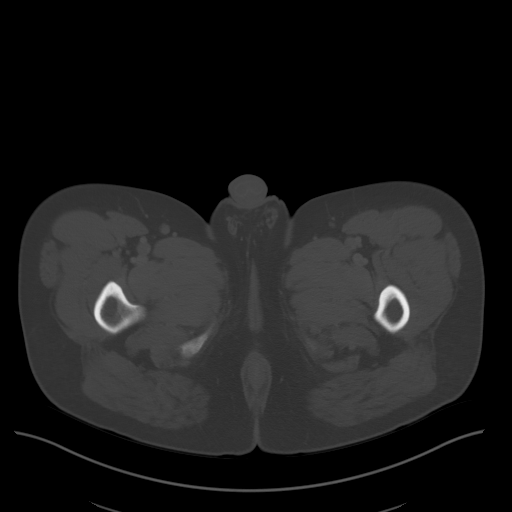
[im 12/93  soft-tissue]
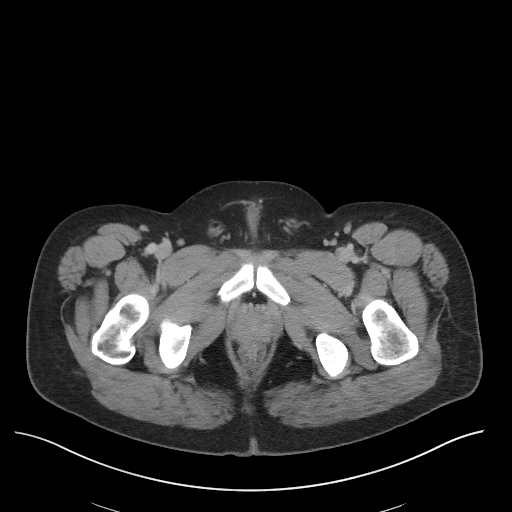
[im 19/93  soft-tissue]
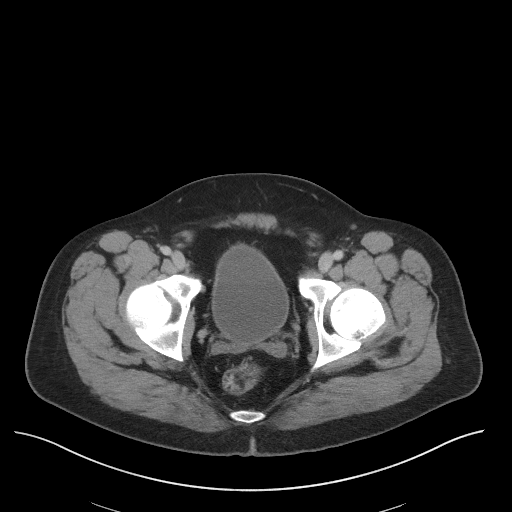
[im 26/93  soft-tissue]
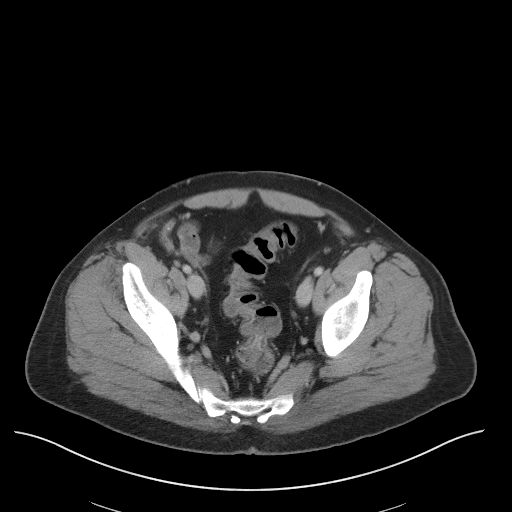
[im 34/93  soft-tissue]
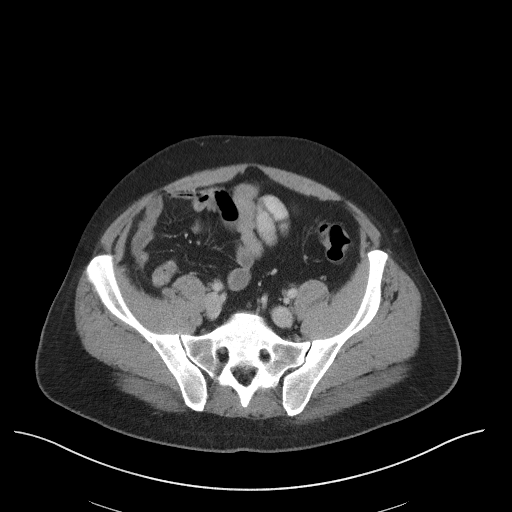
[im 41/93  soft-tissue]
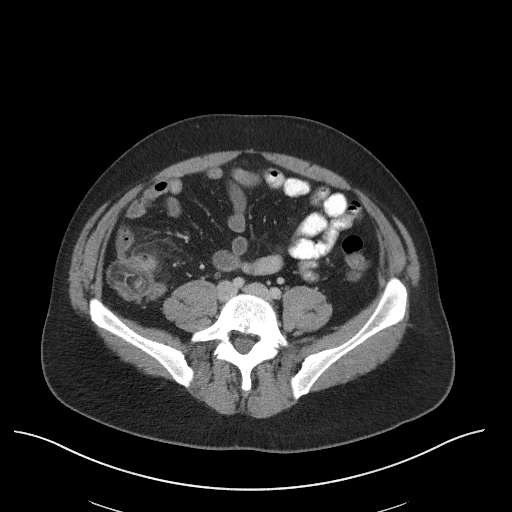
[im 48/93  soft-tissue]
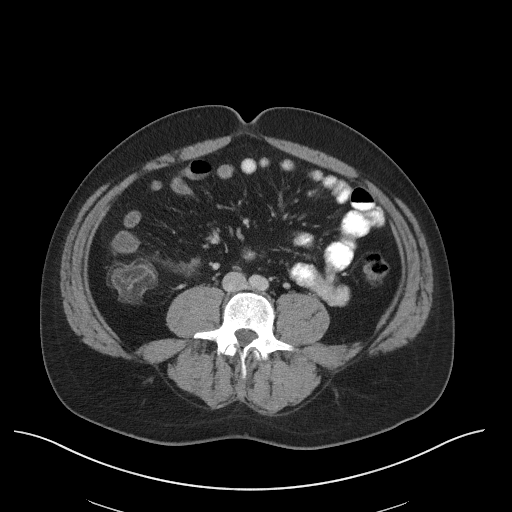
[im 52/93  soft-tissue]
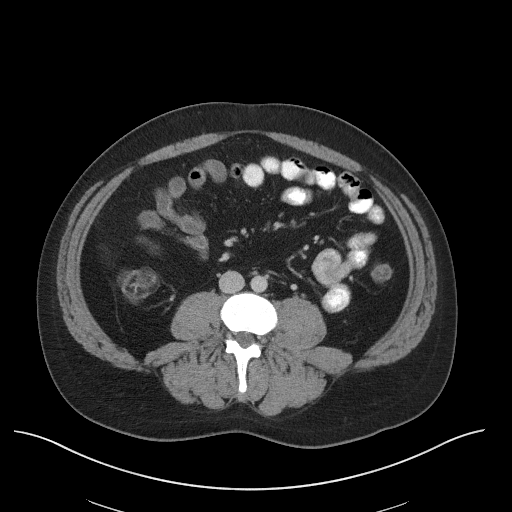
[im 59/93  soft-tissue]
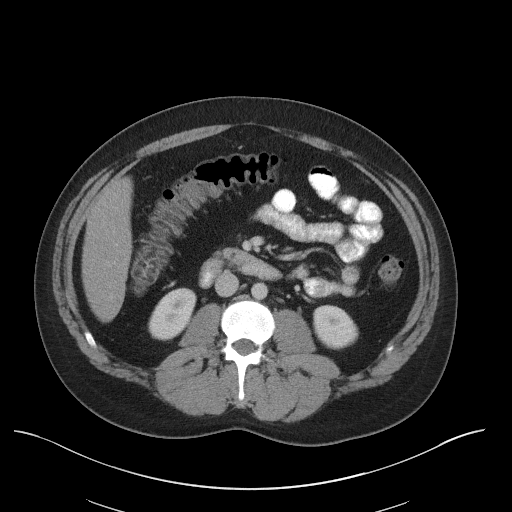
[im 59/93  bone]
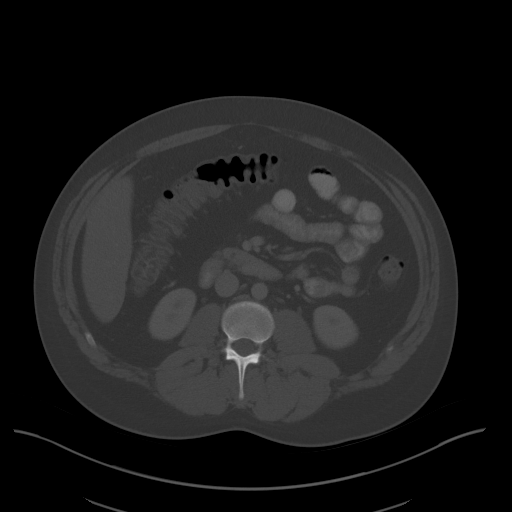
[im 67/93  soft-tissue]
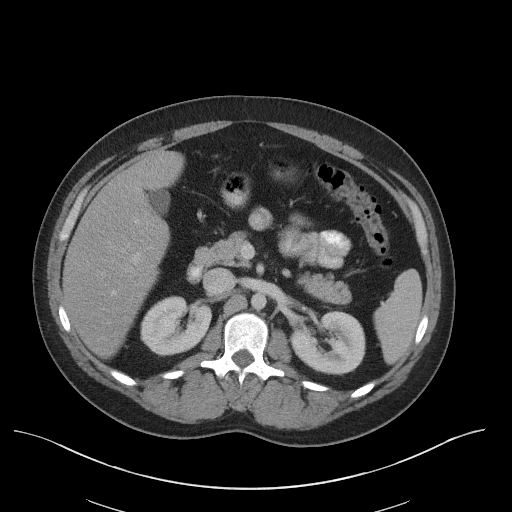
[im 74/93  soft-tissue]
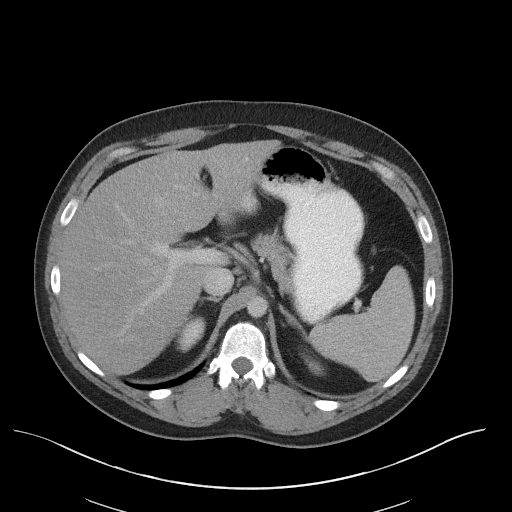
[im 81/93  soft-tissue]
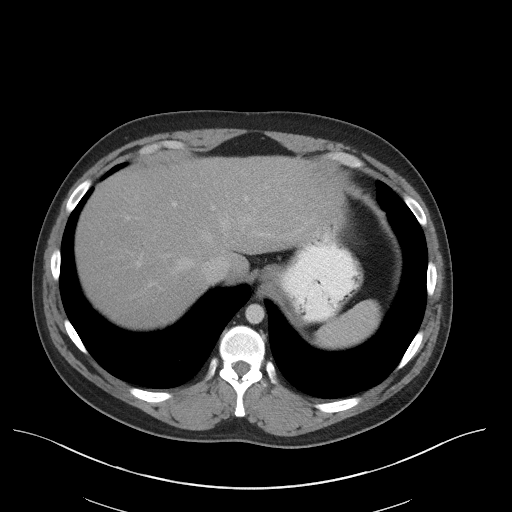
[im 89/93  soft-tissue]
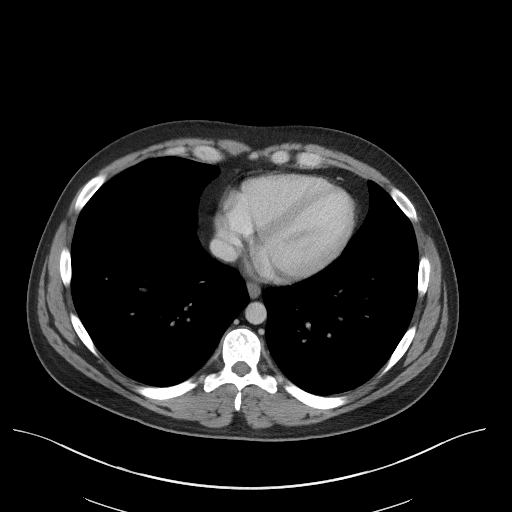

[Series 5: coronal st · coronal · 0.68mm/px · 3 of 95 slices shown]
[im 32/95  soft-tissue]
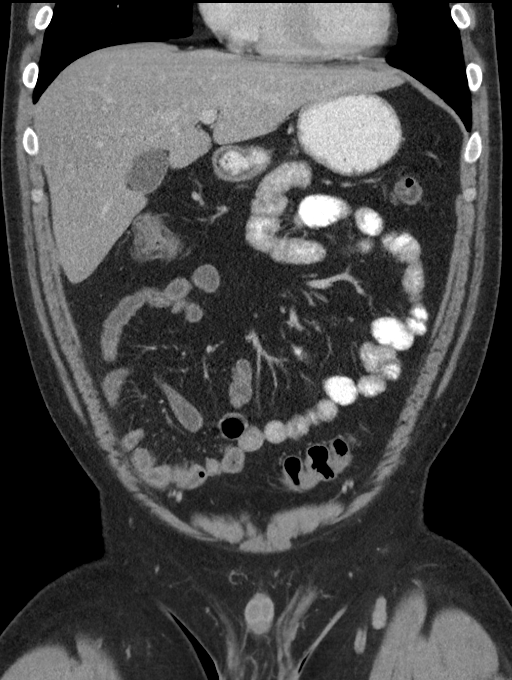
[im 42/95  soft-tissue]
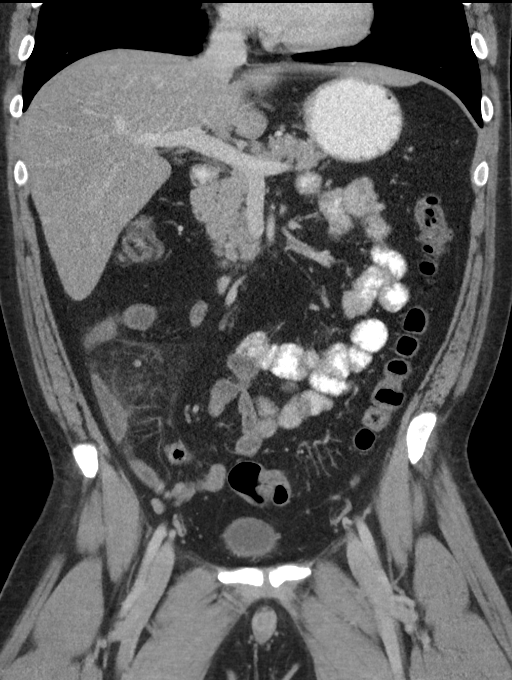
[im 53/95  soft-tissue]
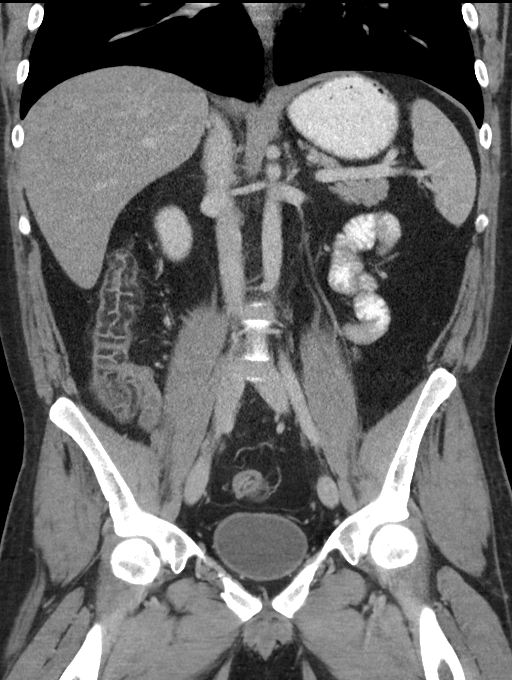

[16 of 46 positions shown; findings below may reference images not displayed]

FINDINGS: Lower chest:  Normal

Hepatobiliary: Mild fatty change of the liver. No calcified
gallstones. No ductal dilatation.

Pancreas: Normal

Spleen: Normal

Adrenals/Urinary Tract: Normal adrenal glands.  Normal kidneys.

Stomach/Bowel: The small bowel appears normal. There is focal edema
of the right colon. There is focal stranding of the pericolic fat
adjacent to the cecum with a dot of air. This is most consistent
with acute diverticulitis. Inflammatory bowel disease with a small
focal rupture could cause this appearance. The appendix, like the
right colon, is mildly thickened, but I do not believe there is
primary appendicitis.

Vascular/Lymphatic: Aorta normal. IVC normal. No retroperitoneal
adenopathy.

Reproductive: Normal

Other: None

Musculoskeletal:  Normal
IMPRESSION: Acute pathology in the region of the cecum/ascending colon with wall
edema, and focal edema in the pericolic fat with a dot of air. Most
likely diagnosis is acute diverticulitis. This finding could be seen
with inflammatory bowel disease with a micro perforation. I do not
think there is acute appendicitis. There is mild edema of the
appendix, similar to the right colon.
